# Patient Record
Sex: Male | Born: 1990 | Race: Black or African American | Hispanic: No | Marital: Single | Smoking: Never smoker
Health system: Southern US, Community
[De-identification: ages and names within clinical notes are randomized; demographics above are authoritative.]

---
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
## 2019-12-06 ENCOUNTER — Other Ambulatory Visit: Payer: Self-pay

## 2019-12-06 ENCOUNTER — Emergency Department (HOSPITAL_COMMUNITY)
Admission: EM | Admit: 2019-12-06 | Discharge: 2019-12-06 | Disposition: A | Discharge status: Home / Self Care | Payer: Self-pay | Attending: Pediatric Emergency Medicine | Admitting: Pediatric Emergency Medicine

## 2019-12-06 ENCOUNTER — Encounter (HOSPITAL_COMMUNITY): Payer: Self-pay | Admitting: Emergency Medicine

## 2019-12-06 DIAGNOSIS — R360 Urethral discharge without blood: Secondary | ICD-10-CM | POA: Insufficient documentation

## 2019-12-06 DIAGNOSIS — R369 Urethral discharge, unspecified: Secondary | ICD-10-CM

## 2019-12-06 DIAGNOSIS — Z114 Encounter for screening for human immunodeficiency virus [HIV]: Secondary | ICD-10-CM | POA: Insufficient documentation

## 2019-12-06 DIAGNOSIS — Z113 Encounter for screening for infections with a predominantly sexual mode of transmission: Secondary | ICD-10-CM | POA: Insufficient documentation

## 2019-12-06 DIAGNOSIS — R300 Dysuria: Secondary | ICD-10-CM | POA: Insufficient documentation

## 2019-12-06 LAB — HIV ANTIBODY (ROUTINE TESTING W REFLEX): HIV Screen 4th Generation wRfx: NONREACTIVE

## 2019-12-06 LAB — RPR: RPR Ser Ql: NONREACTIVE

## 2019-12-06 MED ORDER — DOXYCYCLINE HYCLATE 100 MG PO TABS
100.0000 mg | ORAL_TABLET | Freq: Once | ORAL | Status: AC
  Administered 2019-12-06: 100 mg via ORAL
  Filled 2019-12-06: qty 1

## 2019-12-06 MED ORDER — DOXYCYCLINE HYCLATE 100 MG PO CAPS: 100.0000 mg | ORAL_CAPSULE | Freq: Two times a day (BID) | ORAL | 0 refills | Status: AC

## 2019-12-06 MED ORDER — CEFTRIAXONE SODIUM 500 MG IJ SOLR
500.0000 mg | Freq: Once | INTRAMUSCULAR | Status: AC
  Administered 2019-12-06: 500 mg via INTRAMUSCULAR
  Filled 2019-12-06: qty 500

## 2019-12-06 MED ORDER — LIDOCAINE HCL (PF) 1 % IJ SOLN
INTRAMUSCULAR | Status: AC
  Filled 2019-12-06: qty 5
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
## 2019-12-06 NOTE — ED Triage Notes (Signed)
Pt reports burning with urination and green discharge that started yesterday. Pt concerned for STD.

## 2019-12-09 LAB — GC/CHLAMYDIA PROBE AMP (~~LOC~~) NOT AT ARMC
Chlamydia: POSITIVE — AB
Comment: NEGATIVE
Comment: NORMAL
Neisseria Gonorrhea: POSITIVE — AB

## 2020-07-28 ENCOUNTER — Emergency Department (HOSPITAL_COMMUNITY)
Admission: EM | Admit: 2020-07-28 | Discharge: 2020-07-29 | Disposition: A | Discharge status: Home / Self Care | Payer: 59 | Attending: Emergency Medicine | Admitting: Emergency Medicine

## 2020-07-28 ENCOUNTER — Emergency Department (HOSPITAL_COMMUNITY): Payer: 59

## 2020-07-28 ENCOUNTER — Other Ambulatory Visit: Payer: Self-pay

## 2020-07-28 DIAGNOSIS — Z203 Contact with and (suspected) exposure to rabies: Secondary | ICD-10-CM | POA: Insufficient documentation

## 2020-07-28 DIAGNOSIS — W540XXA Bitten by dog, initial encounter: Secondary | ICD-10-CM | POA: Insufficient documentation

## 2020-07-28 DIAGNOSIS — Z2914 Encounter for prophylactic rabies immune globin: Secondary | ICD-10-CM | POA: Insufficient documentation

## 2020-07-28 DIAGNOSIS — S61452A Open bite of left hand, initial encounter: Secondary | ICD-10-CM | POA: Insufficient documentation

## 2020-07-28 DIAGNOSIS — Y93K9 Activity, other involving animal care: Secondary | ICD-10-CM | POA: Diagnosis not present

## 2020-07-28 DIAGNOSIS — Y92007 Garden or yard of unspecified non-institutional (private) residence as the place of occurrence of the external cause: Secondary | ICD-10-CM | POA: Diagnosis not present

## 2020-07-28 DIAGNOSIS — Z23 Encounter for immunization: Secondary | ICD-10-CM | POA: Insufficient documentation

## 2020-07-28 DIAGNOSIS — Y999 Unspecified external cause status: Secondary | ICD-10-CM | POA: Diagnosis not present

## 2020-07-28 MED ORDER — OXYCODONE-ACETAMINOPHEN 5-325 MG PO TABS
1.0000 | ORAL_TABLET | Freq: Once | ORAL | Status: AC
  Administered 2020-07-29: 1 via ORAL
  Filled 2020-07-28: qty 1

## 2020-07-28 MED ORDER — RABIES IMMUNE GLOBULIN 150 UNIT/ML IM INJ
1500.0000 [IU] | INJECTION | Freq: Once | INTRAMUSCULAR | Status: AC
  Administered 2020-07-29: 1500 [IU] via INTRAMUSCULAR
  Filled 2020-07-28: qty 10

## 2020-07-28 MED ORDER — RABIES VACCINE, PCEC IM SUSR
1.0000 mL | Freq: Once | INTRAMUSCULAR | Status: AC
  Administered 2020-07-29: 1 mL via INTRAMUSCULAR
  Filled 2020-07-28: qty 1

## 2020-07-28 MED ORDER — SODIUM CHLORIDE 0.9 % IV SOLN
3.0000 g | Freq: Once | INTRAVENOUS | Status: AC
Start: 2020-07-28 — End: 2020-07-29
  Administered 2020-07-29: 3 g via INTRAVENOUS
  Filled 2020-07-28: qty 8

## 2020-07-28 MED ORDER — TETANUS-DIPHTH-ACELL PERTUSSIS 5-2.5-18.5 LF-MCG/0.5 IM SUSY
0.5000 mL | PREFILLED_SYRINGE | Freq: Once | INTRAMUSCULAR | Status: AC
  Administered 2020-07-29: 0.5 mL via INTRAMUSCULAR
  Filled 2020-07-28: qty 0.5
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
## 2020-07-29 MED ORDER — IBUPROFEN 600 MG PO TABS: 600.0000 mg | ORAL_TABLET | Freq: Four times a day (QID) | ORAL | 0 refills | Status: AC | PRN

## 2020-07-29 MED ORDER — AMOXICILLIN-POT CLAVULANATE 875-125 MG PO TABS: 1.0000 | ORAL_TABLET | Freq: Two times a day (BID) | ORAL | 0 refills | Status: AC

## 2020-07-29 NOTE — Discharge Instructions (Signed)
You were seen today for dog bite to the left hand.  This was cleaned.  You need to monitor very closely for signs and symptoms of infection.  You should also return to [facility] urgent care on the instructed dates for repeat for rabies vaccination.  Return to the emergency room if you note any increasing redness, swelling, pain to the left hand.

## 2020-07-31 ENCOUNTER — Ambulatory Visit (HOSPITAL_COMMUNITY)
Admission: EM | Admit: 2020-07-31 | Discharge: 2020-07-31 | Disposition: A | Discharge status: Home / Self Care | Payer: 59 | Attending: Internal Medicine | Admitting: Internal Medicine

## 2020-07-31 ENCOUNTER — Other Ambulatory Visit: Payer: Self-pay

## 2020-07-31 DIAGNOSIS — Z203 Contact with and (suspected) exposure to rabies: Secondary | ICD-10-CM

## 2020-07-31 MED ORDER — RABIES VACCINE, PCEC IM SUSR
1.0000 mL | Freq: Once | INTRAMUSCULAR | Status: AC
  Administered 2020-07-31: 1 mL via INTRAMUSCULAR

## 2020-07-31 MED ORDER — RABIES VACCINE, PCEC IM SUSR
INTRAMUSCULAR | Status: AC
  Filled 2020-07-31: qty 1

## 2020-07-31 NOTE — ED Notes (Signed)
Pt educated on his BP being elevated. Pt declined to stay and be evaluated by provider.

## 2020-07-31 NOTE — ED Triage Notes (Signed)
Pt in for 2nd dose of rabies vaccine.  Moderate swelling noted to left hand pt states it is getting better

## 2020-08-04 ENCOUNTER — Ambulatory Visit (HOSPITAL_COMMUNITY)
Admission: EM | Admit: 2020-08-04 | Discharge: 2020-08-04 | Disposition: A | Discharge status: Home / Self Care | Payer: 59 | Attending: Family Medicine | Admitting: Family Medicine

## 2020-08-04 ENCOUNTER — Other Ambulatory Visit: Payer: Self-pay

## 2020-08-04 DIAGNOSIS — Z203 Contact with and (suspected) exposure to rabies: Secondary | ICD-10-CM | POA: Diagnosis not present

## 2020-08-04 MED ORDER — RABIES VACCINE, PCEC IM SUSR
1.0000 mL | Freq: Once | INTRAMUSCULAR | Status: AC
  Administered 2020-08-04: 1 mL via INTRAMUSCULAR

## 2020-08-04 MED ORDER — RABIES VACCINE, PCEC IM SUSR
INTRAMUSCULAR | Status: AC
  Filled 2020-08-04: qty 1

## 2020-08-04 NOTE — ED Triage Notes (Signed)
Pt presents for third dose of rabies injection.

## 2022-04-14 ENCOUNTER — Encounter (HOSPITAL_COMMUNITY): Payer: Self-pay

## 2022-04-14 ENCOUNTER — Emergency Department (HOSPITAL_COMMUNITY): Payer: Commercial Managed Care - HMO

## 2022-04-14 ENCOUNTER — Emergency Department (HOSPITAL_COMMUNITY)
Admission: EM | Admit: 2022-04-14 | Discharge: 2022-04-15 | Disposition: A | Discharge status: Home / Self Care | Payer: Commercial Managed Care - HMO | Attending: Emergency Medicine | Admitting: Emergency Medicine

## 2022-04-14 DIAGNOSIS — I10 Essential (primary) hypertension: Secondary | ICD-10-CM | POA: Diagnosis not present

## 2022-04-14 DIAGNOSIS — Z79899 Other long term (current) drug therapy: Secondary | ICD-10-CM | POA: Diagnosis not present

## 2022-04-14 DIAGNOSIS — Y903 Blood alcohol level of 60-79 mg/100 ml: Secondary | ICD-10-CM | POA: Diagnosis not present

## 2022-04-14 DIAGNOSIS — S81802A Unspecified open wound, left lower leg, initial encounter: Secondary | ICD-10-CM | POA: Diagnosis not present

## 2022-04-14 DIAGNOSIS — W3400XA Accidental discharge from unspecified firearms or gun, initial encounter: Secondary | ICD-10-CM | POA: Diagnosis not present

## 2022-04-14 DIAGNOSIS — Y9302 Activity, running: Secondary | ICD-10-CM | POA: Diagnosis not present

## 2022-04-14 DIAGNOSIS — S8992XA Unspecified injury of left lower leg, initial encounter: Secondary | ICD-10-CM | POA: Diagnosis present

## 2022-04-14 LAB — I-STAT CHEM 8, ED
BUN: 14 mg/dL (ref 6–20)
Calcium, Ion: 1.17 mmol/L (ref 1.15–1.40)
Chloride: 101 mmol/L (ref 98–111)
Creatinine, Ser: 1.5 mg/dL — ABNORMAL HIGH (ref 0.61–1.24)
Glucose, Bld: 108 mg/dL — ABNORMAL HIGH (ref 70–99)
HCT: 42 % (ref 39.0–52.0)
Hemoglobin: 14.3 g/dL (ref 13.0–17.0)
Potassium: 3.2 mmol/L — ABNORMAL LOW (ref 3.5–5.1)
Sodium: 138 mmol/L (ref 135–145)
TCO2: 24 mmol/L (ref 22–32)

## 2022-04-14 LAB — CBC
HCT: 42.6 % (ref 39.0–52.0)
Hemoglobin: 14.9 g/dL (ref 13.0–17.0)
MCH: 31.8 pg (ref 26.0–34.0)
MCHC: 35 g/dL (ref 30.0–36.0)
MCV: 91 fL (ref 80.0–100.0)
Platelets: 293 10*3/uL (ref 150–400)
RBC: 4.68 MIL/uL (ref 4.22–5.81)
RDW: 12.4 % (ref 11.5–15.5)
WBC: 8.5 10*3/uL (ref 4.0–10.5)
nRBC: 0 % (ref 0.0–0.2)

## 2022-04-14 LAB — COMPREHENSIVE METABOLIC PANEL
ALT: 47 U/L — ABNORMAL HIGH (ref 0–44)
AST: 31 U/L (ref 15–41)
Albumin: 4.4 g/dL (ref 3.5–5.0)
Alkaline Phosphatase: 58 U/L (ref 38–126)
Anion gap: 8 (ref 5–15)
BUN: 16 mg/dL (ref 6–20)
CO2: 24 mmol/L (ref 22–32)
Calcium: 9.1 mg/dL (ref 8.9–10.3)
Chloride: 103 mmol/L (ref 98–111)
Creatinine, Ser: 1.37 mg/dL — ABNORMAL HIGH (ref 0.61–1.24)
GFR, Estimated: >60 mL/min (ref >60)
Glucose, Bld: 115 mg/dL — ABNORMAL HIGH (ref 70–99)
Potassium: 2.9 mmol/L — ABNORMAL LOW (ref 3.5–5.1)
Sodium: 135 mmol/L (ref 135–145)
Total Bilirubin: 0.4 mg/dL (ref 0.3–1.2)
Total Protein: 7.8 g/dL (ref 6.5–8.1)

## 2022-04-14 LAB — SAMPLE TO BLOOD BANK

## 2022-04-14 LAB — PROTIME-INR
INR: 1 (ref 0.8–1.2)
Prothrombin Time: 12.8 seconds (ref 11.4–15.2)

## 2022-04-14 LAB — LACTIC ACID, PLASMA: Lactic Acid, Venous: 2.4 mmol/L (ref 0.5–1.9)

## 2022-04-14 LAB — ETHANOL: Alcohol, Ethyl (B): 60 mg/dL — ABNORMAL HIGH (ref <10)

## 2022-04-14 MED ORDER — HYDROMORPHONE HCL 1 MG/ML IJ SOLN
1.0000 mg | Freq: Once | INTRAMUSCULAR | Status: AC
  Administered 2022-04-14: 1 mg via INTRAVENOUS
  Filled 2022-04-14: qty 1

## 2022-04-14 MED ORDER — CEFAZOLIN SODIUM-DEXTROSE 2-4 GM/100ML-% IV SOLN
2.0000 g | Freq: Once | INTRAVENOUS | Status: AC
  Administered 2022-04-14: 2 g via INTRAVENOUS
  Filled 2022-04-14: qty 100

## 2022-04-14 MED ORDER — POTASSIUM CHLORIDE CRYS ER 20 MEQ PO TBCR
40.0000 meq | EXTENDED_RELEASE_TABLET | Freq: Once | ORAL | Status: AC
  Administered 2022-04-14: 40 meq via ORAL
  Filled 2022-04-14: qty 2
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
## 2022-04-15 MED ORDER — ONDANSETRON 4 MG PO TBDP
4.0000 mg | ORAL_TABLET | Freq: Once | ORAL | Status: AC
  Administered 2022-04-15: 4 mg via ORAL
  Filled 2022-04-15: qty 1

## 2022-04-15 MED ORDER — HYDROCODONE-ACETAMINOPHEN 5-325 MG PO TABS: 1.0000 | ORAL_TABLET | Freq: Four times a day (QID) | ORAL | 0 refills | Status: AC | PRN

## 2022-04-15 MED ORDER — AMOXICILLIN-POT CLAVULANATE 875-125 MG PO TABS: 1.0000 | ORAL_TABLET | Freq: Two times a day (BID) | ORAL | 0 refills | Status: AC

## 2022-04-15 MED ORDER — BACITRACIN ZINC 500 UNIT/GM EX OINT: 1.0000 | TOPICAL_OINTMENT | Freq: Two times a day (BID) | CUTANEOUS | 0 refills | Status: AC

## 2022-04-15 NOTE — ED Notes (Signed)
Wound cleaned and dressed.
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
